# Patient Record
Sex: Female | Born: 1950 | Race: White | Hispanic: Yes | Marital: Married | Smoking: Never smoker
Health system: Southern US, Community
[De-identification: ages and names within clinical notes are randomized; demographics above are authoritative.]

---

## 1999-11-25 ENCOUNTER — Other Ambulatory Visit: Admission: RE | Admit: 1999-11-25 | Discharge: 1999-11-25 | Payer: Self-pay | Admitting: Gynecology

## 2000-12-18 ENCOUNTER — Other Ambulatory Visit: Admission: RE | Admit: 2000-12-18 | Discharge: 2000-12-18 | Payer: Self-pay | Admitting: Gynecology

## 2001-01-13 ENCOUNTER — Encounter: Admission: RE | Admit: 2001-01-13 | Discharge: 2001-04-13 | Payer: Self-pay | Admitting: Gynecology

## 2002-05-16 ENCOUNTER — Other Ambulatory Visit: Admission: RE | Admit: 2002-05-16 | Discharge: 2002-05-16 | Payer: Self-pay | Admitting: Obstetrics and Gynecology

## 2019-10-14 ENCOUNTER — Encounter (HOSPITAL_BASED_OUTPATIENT_CLINIC_OR_DEPARTMENT_OTHER): Payer: Self-pay | Admitting: *Deleted

## 2019-10-14 ENCOUNTER — Emergency Department (HOSPITAL_BASED_OUTPATIENT_CLINIC_OR_DEPARTMENT_OTHER): Payer: Medicare HMO

## 2019-10-14 ENCOUNTER — Other Ambulatory Visit: Payer: Self-pay

## 2019-10-14 ENCOUNTER — Emergency Department (HOSPITAL_BASED_OUTPATIENT_CLINIC_OR_DEPARTMENT_OTHER)
Admission: EM | Admit: 2019-10-14 | Discharge: 2019-10-14 | Disposition: A | Discharge status: Home / Self Care | Payer: Medicare HMO | Attending: Emergency Medicine | Admitting: Emergency Medicine

## 2019-10-14 DIAGNOSIS — S8002XA Contusion of left knee, initial encounter: Secondary | ICD-10-CM | POA: Insufficient documentation

## 2019-10-14 DIAGNOSIS — Y999 Unspecified external cause status: Secondary | ICD-10-CM | POA: Diagnosis not present

## 2019-10-14 DIAGNOSIS — W010XXA Fall on same level from slipping, tripping and stumbling without subsequent striking against object, initial encounter: Secondary | ICD-10-CM | POA: Diagnosis not present

## 2019-10-14 DIAGNOSIS — Y939 Activity, unspecified: Secondary | ICD-10-CM | POA: Insufficient documentation

## 2019-10-14 DIAGNOSIS — Y929 Unspecified place or not applicable: Secondary | ICD-10-CM | POA: Diagnosis not present

## 2019-10-14 DIAGNOSIS — S8992XA Unspecified injury of left lower leg, initial encounter: Secondary | ICD-10-CM | POA: Diagnosis present

## 2019-10-14 NOTE — ED Triage Notes (Signed)
Tripped last night and fell. Injury to her left knee. Swelling and pain. Ambulatory.

## 2019-10-14 NOTE — ED Provider Notes (Signed)
[facility] EMERGENCY DEPARTMENT Provider Note   CSN: [id number] Arrival date & time: 10/14/19  1633     History Chief Complaint  Patient presents with  . Fall    Amanda Leblanc is a 69 y.o. female.  69 year old female who presents with left knee injury.  Yesterday evening, the patient tripped and fell forward, landing on her left knee, her hands, and bumping her forehead.  She did not lose consciousness.  She has had no vomiting, confusion, or severe headache.  Since the fall, she has had progressively worsening swelling of her left knee.  She notes that she is able to walk around okay but it is mostly painful to the touch.  She has been applying ice.  She denies any anticoagulant use.  She notes that she fell in the same knee several months ago.  The history is provided by the patient.  Fall       History reviewed. No pertinent past medical history.  There are no problems to display for this patient.   History reviewed. No pertinent surgical history.   OB History   No obstetric history on file.     No family history on file.  Social History   Tobacco Use  . Smoking status: Never Smoker  . Smokeless tobacco: Never Used  Substance Use Topics  . Alcohol use: Yes  . Drug use: Never    Home Medications Prior to Admission medications   Not on File    Allergies    Patient has no known allergies.  Review of Systems   Review of Systems  Musculoskeletal: Positive for joint swelling.  Skin: Positive for color change. Negative for wound.  Neurological: Negative for numbness.    Physical Exam Updated Vital Signs BP (!) 153/88   Pulse 85   Temp 99.3 F (37.4 C) (Oral)   Resp 20   Ht 5\' 7"  (1.702 m)   Wt 72.6 kg   SpO2 98%   BMI 25.06 kg/m   Physical Exam Vitals and nursing note reviewed.  Constitutional:      General: She is not in acute distress.    Appearance: She is well-developed.  HENT:     Head: Normocephalic and atraumatic.  Eyes:       Conjunctiva/sclera: Conjunctivae normal.  Musculoskeletal:     Cervical back: Neck supple.     Comments: Edema and ecchymosis of L anterior knee w/ swelling of pre-patellar bursa, tender to palpation; normal ROM, no joint laxity; no ankle tenderness or swelling  Skin:    General: Skin is warm and dry.  Neurological:     Mental Status: She is alert and oriented to person, place, and time.  Psychiatric:        Judgment: Judgment normal.     ED Results / Procedures / Treatments   Labs (all labs ordered are listed, but only abnormal results are displayed) Labs Reviewed - No data to display  EKG None  Radiology DG Knee Complete 4 Views Left  Result Date: 10/14/2019 CLINICAL DATA:  Recent fall with left knee pain, initial encounter EXAM: LEFT KNEE - COMPLETE 4+ VIEW COMPARISON:  None. FINDINGS: No acute fracture or dislocation is noted. Mild patellofemoral degenerative changes are seen. Soft tissue swelling is noted anteriorly over the patella consistent with the recent injury. IMPRESSION: Soft tissue swelling without acute bony abnormality. Electronically Signed   By: 12/12/2019 M.D.   On: 10/14/2019 17:53    Procedures Procedures (including critical care time)  Medications Ordered in ED Medications - No data to display  ED Course  I have reviewed the triage vital signs and the nursing notes.  Pertinent imaging results that were available during my care of the patient were reviewed by me and considered in my medical decision making (see chart for details).    MDM Rules/Calculators/A&P                      XR negative for bony injury. Discussed possibility of pre-patellar hematoma or ligamentous injury given swelling, although no large joint effusion. Discussed ice, elevation, pain control. Instructed to f/u with sports med in 2 weeks if no improvement, for consideration of advanced imaging. Final Clinical Impression(s) / ED Diagnoses Final diagnoses:  Contusion of left  knee, initial encounter    Rx / DC Orders ED Discharge Orders    None       Jaianna Nicoll, Wenda Overland, MD 10/14/19 [phone]

## 2019-10-28 ENCOUNTER — Other Ambulatory Visit: Payer: Self-pay

## 2019-10-28 ENCOUNTER — Encounter: Payer: Self-pay | Admitting: Family Medicine

## 2019-10-28 ENCOUNTER — Ambulatory Visit: Payer: Self-pay

## 2019-10-28 ENCOUNTER — Ambulatory Visit: Payer: Medicare HMO | Admitting: Family Medicine

## 2019-10-28 VITALS — BP 145/88 | HR 79 | Ht 67.0 in | Wt 160.0 lb

## 2019-10-28 DIAGNOSIS — M7042 Prepatellar bursitis, left knee: Secondary | ICD-10-CM

## 2019-10-28 DIAGNOSIS — M7052 Other bursitis of knee, left knee: Secondary | ICD-10-CM

## 2019-10-28 NOTE — Patient Instructions (Signed)
Nice to meet you Please try compression  Please try heat  Please try the exercises  Please send me a message in MyChart with any questions or updates.  Please see me back in 4-6 weeks.   --Dr. Jordan Likes

## 2019-10-28 NOTE — Progress Notes (Signed)
Amanda Leblanc - 69 y.o. female MRN [id number]  Date of birth: 08-Mar-1951  SUBJECTIVE:  Including CC & ROS.  Chief Complaint  Patient presents with  . Knee Injury    left knee x 2 weeks    Amanda Leblanc is a 69 y.o. female that is presenting with left knee pain.  She had a fall about 2 weeks ago.  She is evaluated in the emergency department.  She had significant swelling and ecchymosis at that time.  It is slowly improved.  She still endorses swelling over the anterior aspect of the knee.  She has good range of motion.  Pain is mild in nature.  She is able to walk with no significant pain.  No mechanical symptoms.  Independent review of the left knee x-rays from 1/8 shows no significant degenerative changes.   Review of Systems See HPI   HISTORY: Past Medical, Surgical, Social, and Family History Reviewed & Updated per EMR.   Pertinent Historical Findings include:  No past medical history on file.  No past surgical history on file.  No Known Allergies  No family history on file.   Social History   Socioeconomic History  . Marital status: Married    Spouse name: Not on file  . Number of children: Not on file  . Years of education: Not on file  . Highest education level: Not on file  Occupational History  . Not on file  Tobacco Use  . Smoking status: Never Smoker  . Smokeless tobacco: Never Used  Substance and Sexual Activity  . Alcohol use: Yes  . Drug use: Never  . Sexual activity: Not on file  Other Topics Concern  . Not on file  Social History Narrative  . Not on file   Social Determinants of Health   Financial Resource Strain:   . Difficulty of Paying Living Expenses: Not on file  Food Insecurity:   . Worried About Programme researcher, broadcasting/film/video in the Last Year: Not on file  . Ran Out of Food in the Last Year: Not on file  Transportation Needs:   . Lack of Transportation (Medical): Not on file  . Lack of Transportation (Non-Medical): Not on file  Physical  Activity:   . Days of Exercise per Week: Not on file  . Minutes of Exercise per Session: Not on file  Stress:   . Feeling of Stress : Not on file  Social Connections:   . Frequency of Communication with Friends and Family: Not on file  . Frequency of Social Gatherings with Friends and Family: Not on file  . Attends Religious Services: Not on file  . Active Member of Clubs or Organizations: Not on file  . Attends Banker Meetings: Not on file  . Marital Status: Not on file  Intimate Partner Violence:   . Fear of Current or Ex-Partner: Not on file  . Emotionally Abused: Not on file  . Physically Abused: Not on file  . Sexually Abused: Not on file     PHYSICAL EXAM:  VS: BP (!) 145/88   Pulse 79   Ht 5\' 7"  (1.702 m)   Wt 160 lb (72.6 kg)   BMI 25.06 kg/m  Physical Exam Gen: NAD, alert, cooperative with exam, well-appearing ENT: normal lips, normal nasal mucosa,  Eye: normal EOM, normal conjunctiva and lids Skin: no rashes, no areas of induration  Neuro: normal tone, normal sensation to touch Psych:  normal insight, alert and oriented MSK:  Left knee:  No obvious effusion.   There is a prepatellar bursa. Normal range of motion. No tenderness to palpation over the medial or lateral joint line. No instability. Negative McMurray's test. Neurovascularly intact  Limited ultrasound: Left knee:  Mild effusion within the suprapatellar pouch. Normal-appearing quadricep and patellar tendon. Overlying prepatellar bursitis/hematoma. Mild narrowing of the medial joint space  Summary: Prepatellar bursitis  Ultrasound and interpretation by Clearance Coots, MD    ASSESSMENT & PLAN:   Prepatellar bursitis of left knee Prepatellar bursitis or hematoma present since her trauma.  Has good range of motion no significant structural changes within the knee. -Ace wrap and counseled on compression. -Counseled on home exercise therapy and supportive care. -Could consider  aspiration if needed.

## 2019-10-28 NOTE — Assessment & Plan Note (Signed)
Prepatellar bursitis or hematoma present since her trauma.  Has good range of motion no significant structural changes within the knee. -Ace wrap and counseled on compression. -Counseled on home exercise therapy and supportive care. -Could consider aspiration if needed.

## 2019-11-13 ENCOUNTER — Ambulatory Visit: Payer: Medicare HMO | Attending: Internal Medicine

## 2019-11-13 DIAGNOSIS — Z23 Encounter for immunization: Secondary | ICD-10-CM

## 2019-11-13 NOTE — Progress Notes (Signed)
   Covid-19 Vaccination Clinic  Name:  Amanda Leblanc    MRN: [id number] DOB: September 23, 1951  11/13/2019  [name] was observed post Covid-19 immunization for 15 minutes without incidence. She was provided with Vaccine Information Sheet and instruction to access the V-Safe system.   [name] was instructed to call 911 with any severe reactions post vaccine: Marland Kitchen Difficulty breathing  . Swelling of your face and throat  . A fast heartbeat  . A bad rash all over your body  . Dizziness and weakness    Immunizations Administered    Name Date Dose VIS Date Route   Pfizer COVID-19 Vaccine 11/13/2019  5:15 PM 0.3 mL 09/16/2019 Intramuscular   Manufacturer: [employer], [employer]   Lot: [id number]   NDC: [address]-8

## 2019-12-01 ENCOUNTER — Ambulatory Visit: Payer: Medicare HMO

## 2019-12-02 ENCOUNTER — Other Ambulatory Visit: Payer: Self-pay

## 2019-12-02 ENCOUNTER — Ambulatory Visit: Payer: Self-pay

## 2019-12-02 ENCOUNTER — Encounter: Payer: Self-pay | Admitting: Family Medicine

## 2019-12-02 ENCOUNTER — Ambulatory Visit: Payer: Medicare HMO | Admitting: Family Medicine

## 2019-12-02 VITALS — BP 150/81 | HR 85 | Ht 67.0 in | Wt 160.0 lb

## 2019-12-02 DIAGNOSIS — M7042 Prepatellar bursitis, left knee: Secondary | ICD-10-CM

## 2019-12-02 DIAGNOSIS — M7989 Other specified soft tissue disorders: Secondary | ICD-10-CM | POA: Diagnosis not present

## 2019-12-02 NOTE — Assessment & Plan Note (Addendum)
Appears to be maturing with hyperechoic change. May have been more of a hematoma as opposed to a bursa. -Counseled on compression. -Counseled on supportive care. -Can follow-up as needed.

## 2019-12-02 NOTE — Progress Notes (Signed)
Amanda Leblanc - 69 y.o. female MRN [id number]  Date of birth: Jan 08, 1951  SUBJECTIVE:  Including CC & ROS.  Chief Complaint  Patient presents with  . Follow-up    follow up for left knee    Amanda Leblanc is a 69 y.o. female that is following up her left knee problem.  She is having some continued numbness over the anterior aspect of the knee where she sustained a trauma.  She is also having more swelling in the left leg than the right leg.  She has good range of motion.  Denies any significant pain.  Review of Systems See HPI   HISTORY: Past Medical, Surgical, Social, and Family History Reviewed & Updated per EMR.   Pertinent Historical Findings include:  No past medical history on file.  No past surgical history on file.  No family history on file.  Social History   Socioeconomic History  . Marital status: Married    Spouse name: Not on file  . Number of children: Not on file  . Years of education: Not on file  . Highest education level: Not on file  Occupational History  . Not on file  Tobacco Use  . Smoking status: Never Smoker  . Smokeless tobacco: Never Used  Substance and Sexual Activity  . Alcohol use: Yes  . Drug use: Never  . Sexual activity: Not on file  Other Topics Concern  . Not on file  Social History Narrative  . Not on file   Social Determinants of Health   Financial Resource Strain:   . Difficulty of Paying Living Expenses: Not on file  Food Insecurity:   . Worried About Programme researcher, broadcasting/film/video in the Last Year: Not on file  . Ran Out of Food in the Last Year: Not on file  Transportation Needs:   . Lack of Transportation (Medical): Not on file  . Lack of Transportation (Non-Medical): Not on file  Physical Activity:   . Days of Exercise per Week: Not on file  . Minutes of Exercise per Session: Not on file  Stress:   . Feeling of Stress : Not on file  Social Connections:   . Frequency of Communication with Friends and Family: Not on file  .  Frequency of Social Gatherings with Friends and Family: Not on file  . Attends Religious Services: Not on file  . Active Member of Clubs or Organizations: Not on file  . Attends Banker Meetings: Not on file  . Marital Status: Not on file  Intimate Partner Violence:   . Fear of Current or Ex-Partner: Not on file  . Emotionally Abused: Not on file  . Physically Abused: Not on file  . Sexually Abused: Not on file     PHYSICAL EXAM:  VS: BP (!) 150/81   Pulse 85   Ht 5\' 7"  (1.702 m)   Wt 160 lb (72.6 kg)   BMI 25.06 kg/m  Physical Exam Gen: NAD, alert, cooperative with exam, well-appearing MSK:  Left knee/leg: Healing hematoma on the anterior aspect of the knee.  Some redness in this area but no streaking. Normal range of motion. Normal strength resistance. Mild swelling of the lower leg compared to the right leg. Neurovascularly intact  Limited ultrasound: Left knee/leg:  There is hyperechoic change of the hematoma/bursa which is showing maturation.  Normal-appearing patellar tendon. No Baker's cyst appreciated. Veins of the lower leg are compressible  Summary: Maturing hematoma/bursa.  Ultrasound and interpretation by , MD  ASSESSMENT & PLAN:   Prepatellar bursitis of left knee Appears to be maturing with hyperechoic change. May have been more of a hematoma as opposed to a bursa. -Counseled on compression. -Counseled on supportive care. -Can follow-up as needed.  Left leg swelling Slightly more swelling when compared to right. No redness or tenderness on exam. US showing veins compressible.  -Counseled to monitor. -If any change would obtain a formal Doppler

## 2019-12-02 NOTE — Patient Instructions (Signed)
Good to see you Please continue heat, massage, and compression on the area of the knee  Please let me know if the swelling changes in the leg at all   Please send me a message in MyChart with any questions or updates.  Please see [place] back as needed.   --Dr. Jordan Likes

## 2019-12-02 NOTE — Assessment & Plan Note (Signed)
Slightly more swelling when compared to right. No redness or tenderness on exam. US showing veins compressible.  -Counseled to monitor. -If any change would obtain a formal Doppler

## 2019-12-08 ENCOUNTER — Ambulatory Visit: Payer: Medicare HMO | Attending: Internal Medicine

## 2019-12-08 DIAGNOSIS — Z23 Encounter for immunization: Secondary | ICD-10-CM | POA: Insufficient documentation

## 2019-12-08 NOTE — Progress Notes (Signed)
   Covid-19 Vaccination Clinic  Name:  Amanda Leblanc    MRN: [id number] DOB: 13-Dec-1950  12/08/2019  Amanda Leblanc was observed post Covid-19 immunization for 15 minutes without incident. She was provided with Vaccine Information Sheet and instruction to access the V-Safe system.   Amanda Leblanc was instructed to call 911 with any severe reactions post vaccine: Marland Kitchen Difficulty breathing  . Swelling of face and throat  . A fast heartbeat  . A bad rash all over body  . Dizziness and weakness   Immunizations Administered    Name Date Dose VIS Date Route   Pfizer COVID-19 Vaccine 12/08/2019 12:21 PM 0.3 mL 09/16/2019 Intramuscular   Manufacturer: [employer], [employer]   Lot: [id number]   NDC: [address]-9

## 2021-09-13 IMAGING — DX DG KNEE COMPLETE 4+V*L*
4 series · 4 of 4 positions shown · non-contrast
Comparison: None.

CLINICAL DATA: Recent fall with left knee pain, initial encounter

EXAM:
LEFT KNEE - COMPLETE 4+ VIEW

[knee ap]
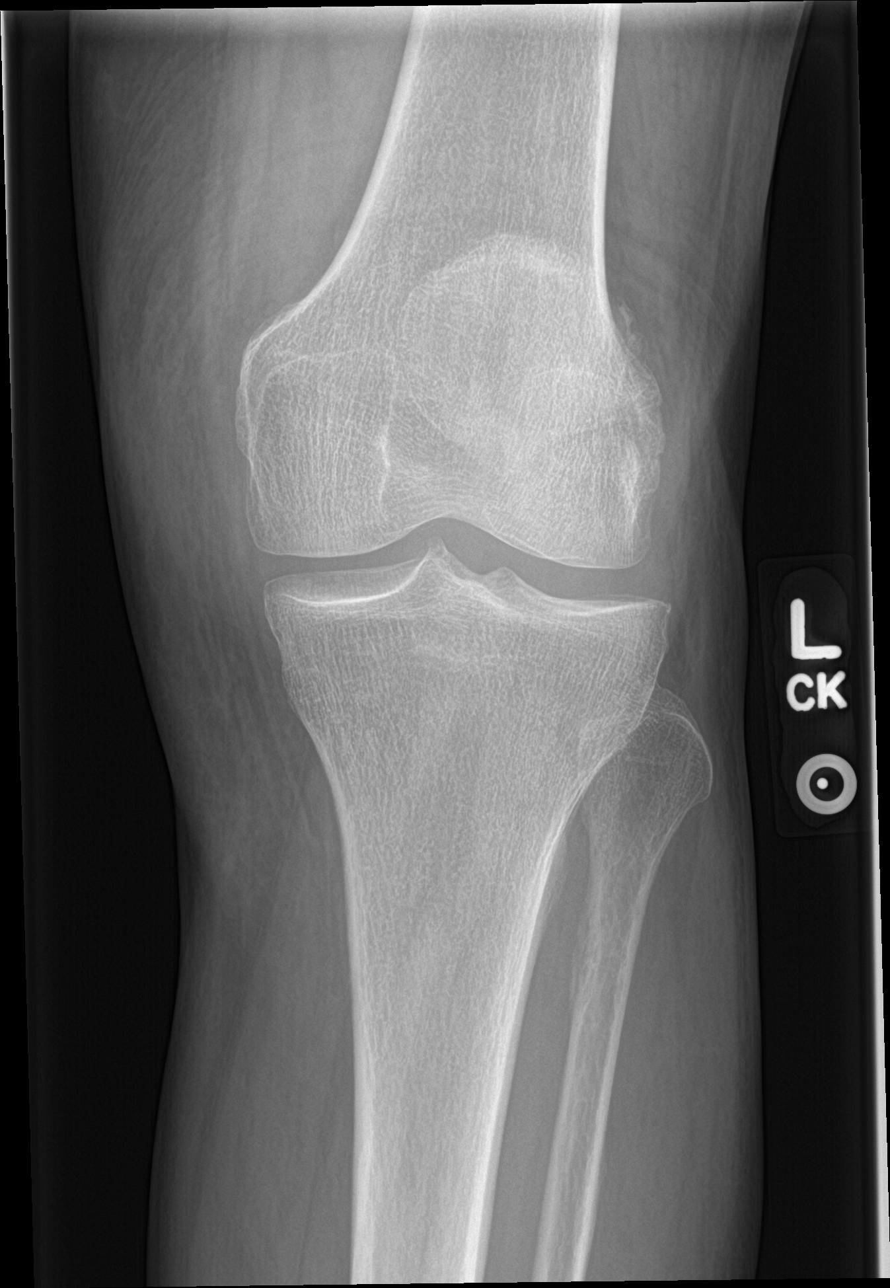

[knee lat]
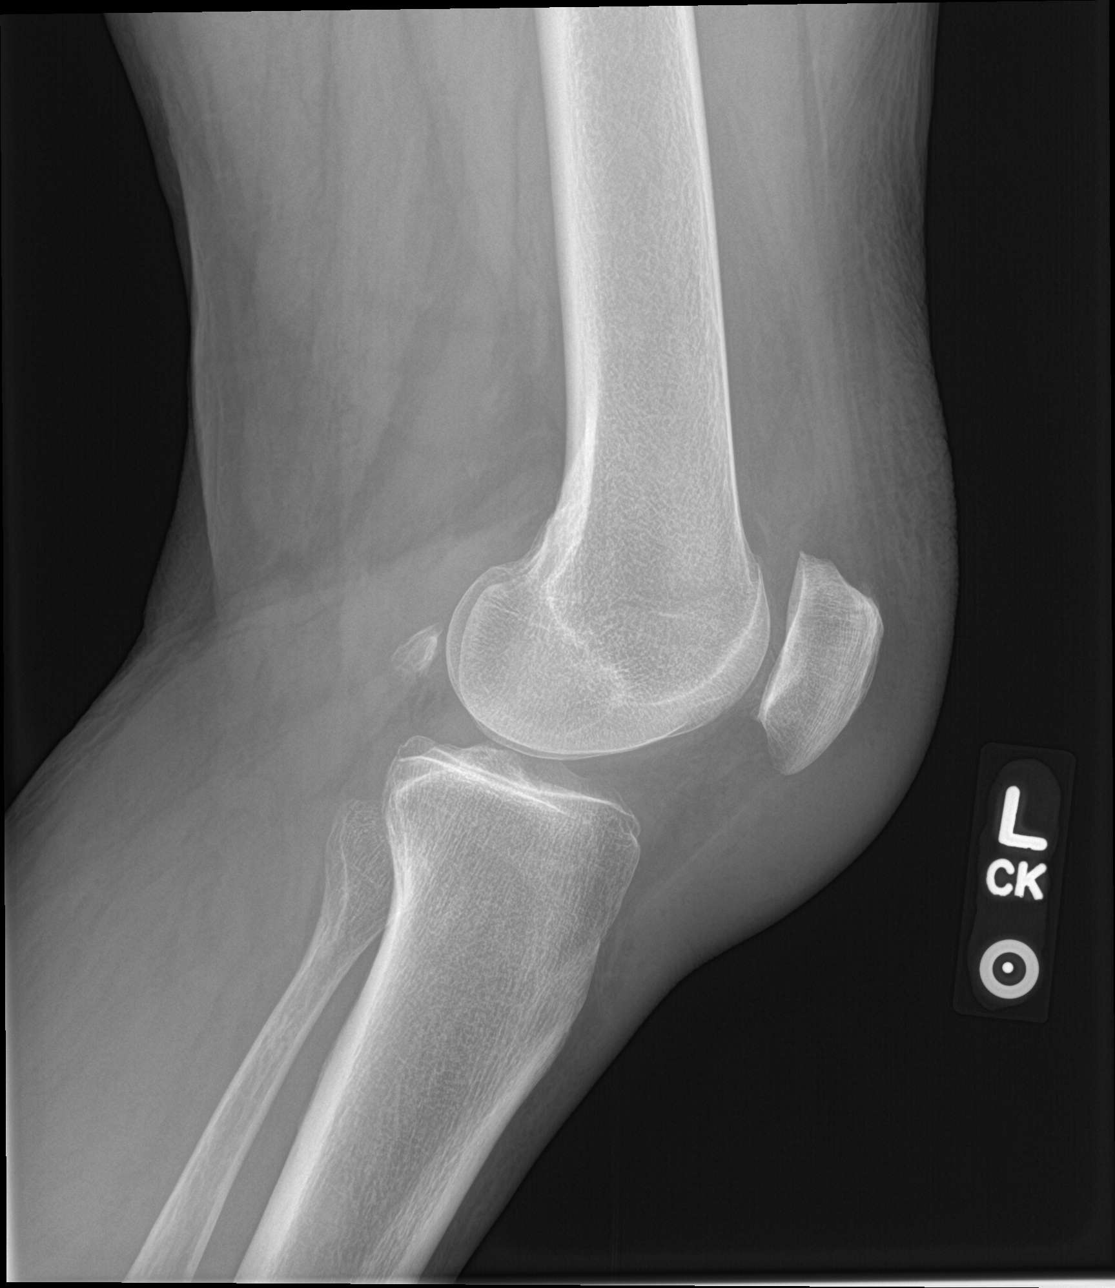

[knee obl (1 of 2)]
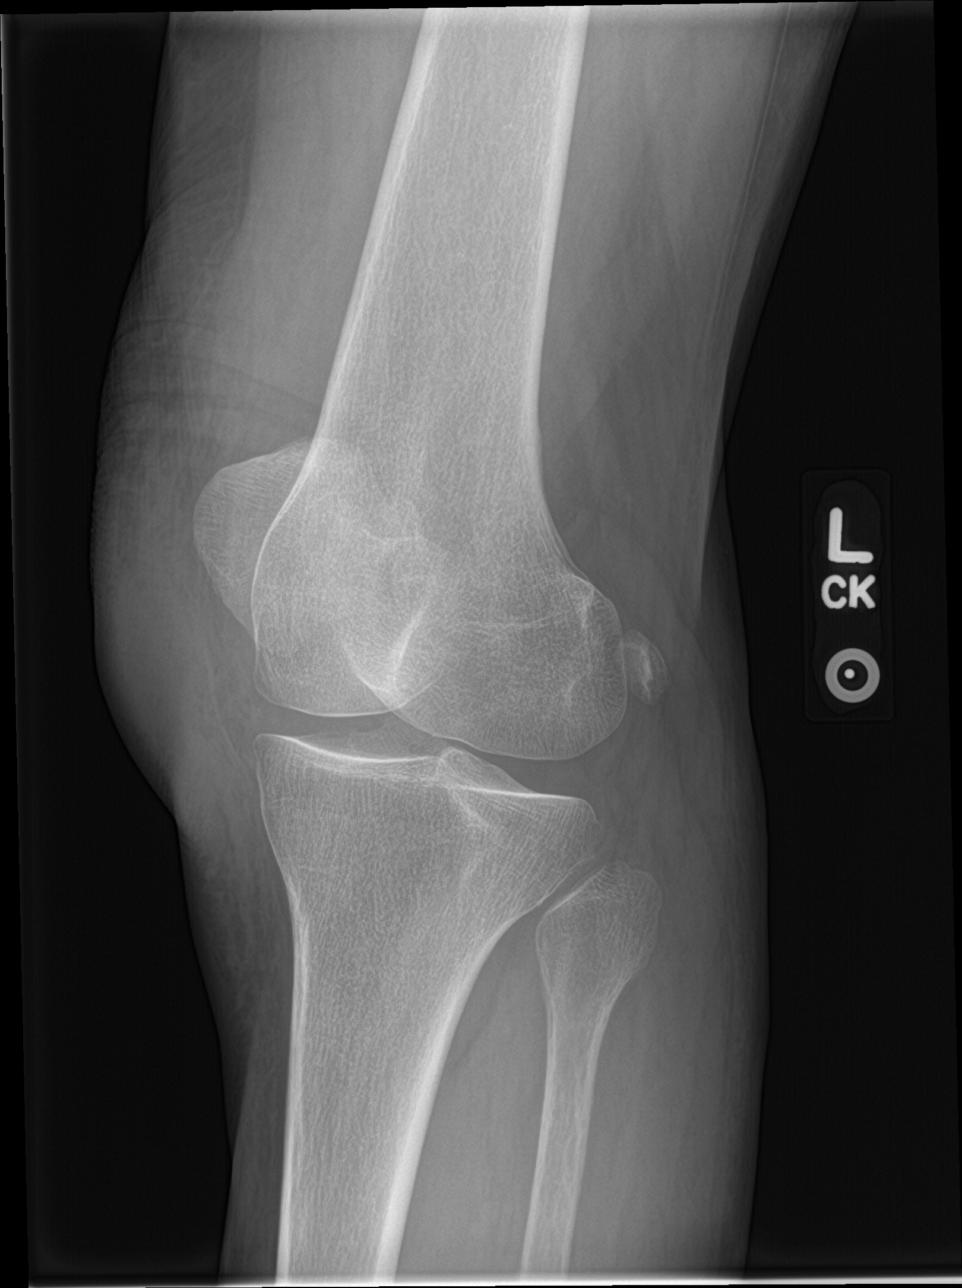

[knee obl (2 of 2)]
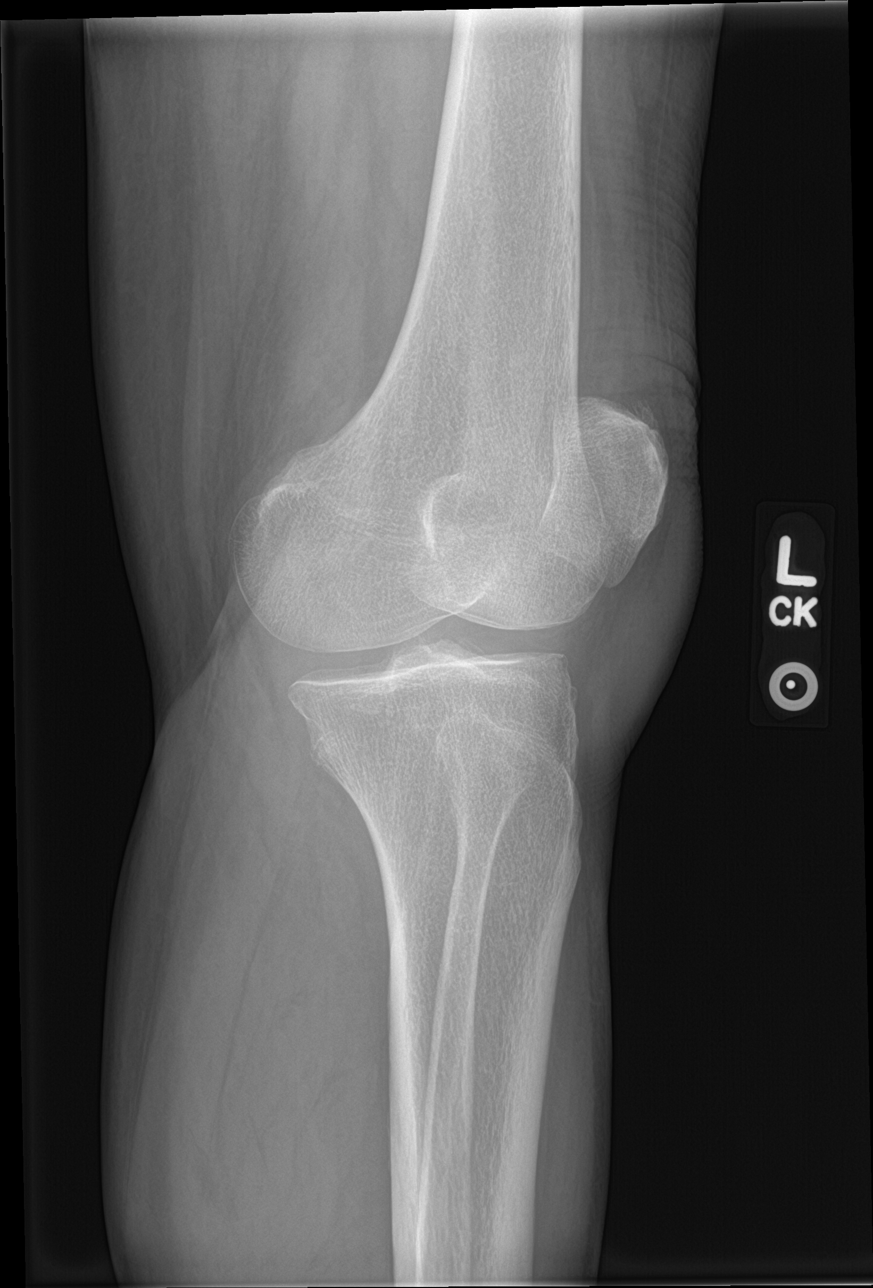

[4 of 4 positions shown; findings below may reference images not displayed]

FINDINGS: No acute fracture or dislocation is noted. Mild patellofemoral
degenerative changes are seen. Soft tissue swelling is noted
anteriorly over the patella consistent with the recent injury.
IMPRESSION: Soft tissue swelling without acute bony abnormality.

## 2023-01-21 ENCOUNTER — Encounter: Payer: Self-pay | Admitting: *Deleted

## 2023-03-11 ENCOUNTER — Emergency Department (HOSPITAL_COMMUNITY): Payer: Medicare HMO

## 2023-03-11 ENCOUNTER — Emergency Department (HOSPITAL_COMMUNITY)
Admission: EM | Admit: 2023-03-11 | Discharge: 2023-03-11 | Disposition: A | Discharge status: Home / Self Care | Payer: Medicare HMO | Attending: Emergency Medicine | Admitting: Emergency Medicine

## 2023-03-11 ENCOUNTER — Encounter (HOSPITAL_COMMUNITY): Payer: Self-pay

## 2023-03-11 ENCOUNTER — Other Ambulatory Visit: Payer: Self-pay

## 2023-03-11 DIAGNOSIS — S43004A Unspecified dislocation of right shoulder joint, initial encounter: Secondary | ICD-10-CM

## 2023-03-11 DIAGNOSIS — S40911A Unspecified superficial injury of right shoulder, initial encounter: Secondary | ICD-10-CM | POA: Diagnosis present

## 2023-03-11 DIAGNOSIS — Y92009 Unspecified place in unspecified non-institutional (private) residence as the place of occurrence of the external cause: Secondary | ICD-10-CM | POA: Diagnosis not present

## 2023-03-11 DIAGNOSIS — W19XXXA Unspecified fall, initial encounter: Secondary | ICD-10-CM | POA: Insufficient documentation

## 2023-03-11 MED ORDER — HYDROMORPHONE HCL 1 MG/ML IJ SOLN
0.5000 mg | Freq: Once | INTRAMUSCULAR | Status: AC
  Administered 2023-03-11: 0.5 mg via INTRAVENOUS
  Filled 2023-03-11: qty 1

## 2023-03-11 MED ORDER — PROPOFOL 10 MG/ML IV BOLUS
30.0000 mg | Freq: Once | INTRAVENOUS | Status: AC
  Administered 2023-03-11: 30 mg via INTRAVENOUS
  Filled 2023-03-11: qty 20

## 2023-03-11 MED ORDER — PROPOFOL 10 MG/ML IV BOLUS
20.0000 mg | Freq: Once | INTRAVENOUS | Status: AC
  Administered 2023-03-11: 20 mg via INTRAVENOUS

## 2023-03-11 MED ORDER — IBUPROFEN 800 MG PO TABS: 800.0000 mg | ORAL_TABLET | Freq: Three times a day (TID) | ORAL | 0 refills | Status: AC | PRN

## 2023-03-11 NOTE — ED Notes (Signed)
Patient back to baseline mentation

## 2023-03-11 NOTE — ED Notes (Signed)
X-ray at bedside

## 2023-03-11 NOTE — ED Triage Notes (Signed)
  Pt BIBA from home after a fall. Denies LOC, No Blood thinners.Arrived in sling

## 2023-03-11 NOTE — ED Notes (Signed)
MD  Runell Gess, Ed Tech and Respiratory at bedside

## 2023-03-11 NOTE — ED Notes (Signed)
Pt d/c with friend who came to pick her up. Walked patient to [employer], outside to friends car. AAOX4, NAD. Ambulatory with stead gait. D/c instructions reviewed. All questions answered.

## 2023-03-11 NOTE — ED Notes (Signed)
MD Zammit  began manipulating shoulder

## 2023-03-11 NOTE — ED Provider Notes (Signed)
[place] EMERGENCY DEPARTMENT AT [facility] Provider Note   CSN: [id number] Arrival date & time: 03/11/23  1153     History {Add pertinent medical, surgical, social history, OB history to HPI:1} Chief Complaint  Patient presents with   Fall    Pt BIBA from home after a fall. Denies LOC, No Blood thinners.Arrived in sling.    Amanda Leblanc is a 72 y.o. female.  Patient states she fell accidentally at the store and hurt her right shoulder.  Patient did not hit her head.   Fall       Home Medications Prior to Admission medications   Medication Sig Start Date End Date Taking? Authorizing Provider  ibuprofen (ADVIL) 800 MG tablet Take 1 tablet (800 mg total) by mouth every 8 (eight) hours as needed for moderate pain. 03/11/23  Yes Bethann Berkshire, MD      Allergies    Patient has no known allergies.    Review of Systems   Review of Systems  Physical Exam Updated Vital Signs BP 122/69   Pulse 63   Temp 98.3 F (36.8 C) (Oral)   Resp 13   Ht 5\' 7"  (1.702 m)   Wt 69.9 kg   SpO2 95%   BMI 24.12 kg/m  Physical Exam  ED Results / Procedures / Treatments   Labs (all labs ordered are listed, but only abnormal results are displayed) Labs Reviewed - No data to display  EKG None  Radiology DG Shoulder Right Portable  Result Date: 03/11/2023 CLINICAL DATA:  Reduction of anterior dislocation EXAM: RIGHT SHOULDER - 1 VIEW COMPARISON:  Study done earlier today FINDINGS: There is interval reduction of anterior dislocation. Hill-Sachs deformity seen in proximal humerus. IMPRESSION: Interval reduction of anterior dislocation. Hill-Sachs deformity is seen in proximal right humerus. Electronically Signed   By: Ernie Avena M.D.   On: 03/11/2023 13:54   DG Shoulder Right Portable  Result Date: 03/11/2023 CLINICAL DATA:  Fall onto right shoulder EXAM: RIGHT SHOULDER - 1 VIEW COMPARISON:  None Available. FINDINGS: There is anterior dislocation of the right  glenohumeral joint. No visible fracture. AC joint is intact. Right lung clear. IMPRESSION: Right shoulder anterior dislocation. Electronically Signed   By: Charlett Nose M.D.   On: 03/11/2023 12:22    Procedures Procedures  {Document cardiac monitor, telemetry assessment procedure when appropriate:1}  Medications Ordered in ED Medications  HYDROmorphone (DILAUDID) injection 0.5 mg (0.5 mg Intravenous Given 03/11/23 1254)  propofol (DIPRIVAN) 10 mg/mL bolus/IV push 30 mg (30 mg Intravenous Given 03/11/23 1305)  propofol (DIPRIVAN) 10 mg/mL bolus/IV push 20 mg (20 mg Intravenous Given 03/11/23 1308)    ED Course/ Medical Decision Making/ A&P   {   Click here for ABCD2, HEART and other calculatorsREFRESH Note before signing :1}                          Medical Decision Making Amount and/or Complexity of Data Reviewed Radiology: ordered.  Risk Prescription drug management.   Patient with a dislocated right shoulder that was reduced in the emergency department.  She is given a sling and Motrin and will follow-up with Ortho  {Document critical care time when appropriate:1} {Document review of labs and clinical decision tools ie heart score, Chads2Vasc2 etc:1}  {Document your independent review of radiology images, and any outside records:1} {Document your discussion with family members, caretakers, and with consultants:1} {Document social determinants of health affecting pt's care:1} {Document your decision  making why or why not admission, treatments were needed:1} Final Clinical Impression(s) / ED Diagnoses Final diagnoses:  Dislocation of right shoulder joint, initial encounter    Rx / DC Orders ED Discharge Orders          Ordered    ibuprofen (ADVIL) 800 MG tablet  Every 8 hours PRN        03/11/23 1457

## 2023-03-11 NOTE — ED Notes (Signed)
Pt waiting on ride

## 2023-03-11 NOTE — Discharge Instructions (Signed)
Follow-up with Dr. August Saucer or whichever orthopedic you would like to see.

## 2023-03-12 ENCOUNTER — Telehealth: Payer: Self-pay | Admitting: Orthopedic Surgery

## 2023-03-12 NOTE — Telephone Encounter (Signed)
Pt called and she was seen at [facility] ED for right dislocated shoulder. Discharge to see August Saucer . No open appts. Please call pt about this matter at [phone].

## 2023-03-13 NOTE — Telephone Encounter (Signed)
Can you please cb to schedule on one of these days

## 2023-03-13 NOTE — Telephone Encounter (Signed)
How quickly does this need to be worked in?

## 2023-03-13 NOTE — Telephone Encounter (Signed)
I do not think this is necessarily an urgent work in.  We can see her back in either Wednesday the 12th or Friday the 14th

## 2023-03-16 ENCOUNTER — Ambulatory Visit: Payer: Medicare HMO | Admitting: Orthopedic Surgery

## 2023-03-16 DIAGNOSIS — S43014A Anterior dislocation of right humerus, initial encounter: Secondary | ICD-10-CM | POA: Diagnosis not present

## 2023-03-17 ENCOUNTER — Encounter: Payer: Self-pay | Admitting: Orthopedic Surgery

## 2023-03-17 NOTE — Progress Notes (Signed)
   Office Visit Note   Patient: Amanda Leblanc           Date of Birth: 25-May-1951           MRN: [id number] Visit Date: 03/16/2023 Requested by: Caffie Damme, MD [address] [place],  [place] [address] PCP: Caffie Damme, MD  Subjective: Chief Complaint  Patient presents with   Right Shoulder - Dislocation, Follow-up    DOI 03/11/2023    HPI: Amanda Leblanc is a 73 y.o. female who presents to the office reporting right shoulder dislocation.  Date of injury 03/11/2023.  Dislocated the right shoulder after a fall.  Reduced in the emergency department.  Radiographs confirm reduction.  No associated fracture.  She was in the sling for 3 days and has been using the arm since that time.  Denies much in the way of weakness or recurrent instability.  She did have an injection in that shoulder about 5 years ago.  She is very active.  No medications for pain.  She is right-hand dominant..                ROS: All systems reviewed are negative as they relate to the chief complaint within the history of present illness.  Patient denies fevers or chills.  Assessment & Plan: Visit Diagnoses: No diagnosis found.  Plan: Impression is right shoulder pain following shoulder dislocation.  No recurrent instability.  Motion is pretty reasonable today as well as rotator cuff strength.  She has forward flexion and abduction both to 70 degrees.  We discussed therapy which she wants to try doing some exercises on her own.  Follow-up with [place] in 4 weeks for clinical recheck with decision for or against MRI scanning at that time based on her clinical recovery and presence or absence of rotator cuff weakness.  Follow-Up Instructions: No follow-ups on file.   Orders:  No orders of the defined types were placed in this encounter.  No orders of the defined types were placed in this encounter.     Procedures: No procedures performed   Clinical Data: No additional findings.  Objective: Vital Signs: There were no  vitals taken for this visit.  Physical Exam:  Constitutional: Patient appears well-developed HEENT:  Head: Normocephalic Eyes:EOM are normal Neck: Normal range of motion Cardiovascular: Normal rate Pulmonary/chest: Effort normal Neurologic: Patient is alert Skin: Skin is warm Psychiatric: Patient has normal mood and affect  Ortho Exam: Ortho exam demonstrates functional deltoid.  Slight difference in global rotator cuff strength right versus left but nothing isolated.  No Popeye deformity.  Motor sensory function to the hand is intact.  Passive range of motion without crepitus but with mild pain above 70 degrees of forward flexion and abduction.  Specialty Comments:  No specialty comments available.  Imaging: No results found.   PMFS History: Patient Active Problem List   Diagnosis Date Noted   Left leg swelling 12/02/2019   Prepatellar bursitis of left knee 10/28/2019   No past medical history on file.  No family history on file.  No past surgical history on file. Social History   Occupational History   Not on file  Tobacco Use   Smoking status: Never   Smokeless tobacco: Never  Substance and Sexual Activity   Alcohol use: Yes   Drug use: Never   Sexual activity: Not on file

## 2023-04-02 ENCOUNTER — Other Ambulatory Visit (HOSPITAL_BASED_OUTPATIENT_CLINIC_OR_DEPARTMENT_OTHER): Payer: Self-pay | Admitting: Family Medicine

## 2023-04-02 DIAGNOSIS — Z1231 Encounter for screening mammogram for malignant neoplasm of breast: Secondary | ICD-10-CM

## 2023-04-06 ENCOUNTER — Ambulatory Visit (HOSPITAL_BASED_OUTPATIENT_CLINIC_OR_DEPARTMENT_OTHER)
Admission: RE | Admit: 2023-04-06 | Discharge: 2023-04-06 | Disposition: A | Discharge status: Home / Self Care | Payer: Medicare HMO | Source: Ambulatory Visit | Attending: Family Medicine | Admitting: Family Medicine

## 2023-04-06 ENCOUNTER — Encounter (HOSPITAL_BASED_OUTPATIENT_CLINIC_OR_DEPARTMENT_OTHER): Payer: Self-pay

## 2023-04-06 DIAGNOSIS — Z1231 Encounter for screening mammogram for malignant neoplasm of breast: Secondary | ICD-10-CM | POA: Insufficient documentation

## 2023-04-17 ENCOUNTER — Ambulatory Visit: Payer: Medicare HMO | Admitting: Orthopedic Surgery

## 2023-04-17 ENCOUNTER — Encounter: Payer: Self-pay | Admitting: Orthopedic Surgery

## 2023-04-17 DIAGNOSIS — S43014A Anterior dislocation of right humerus, initial encounter: Secondary | ICD-10-CM | POA: Diagnosis not present

## 2023-04-17 NOTE — Progress Notes (Signed)
   Office Visit Note   Patient: Amanda Leblanc           Date of Birth: 10-04-1951           MRN: [id number] Visit Date: 04/17/2023 Requested by: Caffie Damme, MD [address] [place],  [place] [address] PCP: Caffie Damme, MD  Subjective: Chief Complaint  Patient presents with   Right Shoulder - Injury    HPI: Amanda Leblanc is a 72 y.o. female who presents to the office reporting Amanda Leblanc is a 72 year old patient with right shoulder dislocation.  Date of injury 03/11/2023.  Decision point today was for or against MRI scanning.  She states she does have to use her left arm to help raise the right arm sequentially.  Pain occasionally wakes her from sleep at night.  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                ROS: All systems reviewed are negative as they relate to the chief complaint within the history of present illness.  Patient denies fevers or chills.  Assessment & Plan: Visit Diagnoses:  1. Anterior dislocation of right shoulder, initial encounter     Plan: Impression is shoulder dislocation in a 72 year old patient.  Today her examination and history is more consistent with rotator cuff pathology.  Having some weakness and some coarseness with rotation.  She is very active.  Plan MRI arthrogram right shoulder to evaluate rotator cuff tear following dislocation.  Has not really had any episodes of instability.  We will assess the tear and options at that time.  Follow-Up Instructions: No follow-ups on file.   Orders:  Orders Placed This Encounter  Procedures   MR SHOULDER RIGHT W CONTRAST   Arthrogram   No orders of the defined types were placed in this encounter.     Procedures: No procedures performed   Clinical Data: No additional findings.  Objective: Vital Signs: There were no vitals taken for this visit.  Physical Exam:  Constitutional: Patient appears well-developed HEENT:  Head: Normocephalic Eyes:EOM are  normal Neck: Normal range of motion Cardiovascular: Normal rate Pulmonary/chest: Effort normal Neurologic: Patient is alert Skin: Skin is warm Psychiatric: Patient has normal mood and affect  Ortho Exam: Ortho exam demonstrates functional deltoid.  Does have some coarseness with internal/external rotation 9 degrees of abduction localizing to the anterior lateral aspect of the shoulder.  Slight weakness to supraspinatus testing with 5 out of 5 external rotation on the right 5+ out of 5 on the left.  No masses lymphadenopathy or skin changes noted in that shoulder girdle region.  No Popeye deformity.  Specialty Comments:  No specialty comments available.  Imaging: No results found.   PMFS History: Patient Active Problem List   Diagnosis Date Noted   Left leg swelling 12/02/2019   Prepatellar bursitis of left knee 10/28/2019   History reviewed. No pertinent past medical history.  History reviewed. No pertinent family history.  History reviewed. No pertinent surgical history. Social History   Occupational History   Not on file  Tobacco Use   Smoking status: Never   Smokeless tobacco: Never  Substance and Sexual Activity   Alcohol use: Yes   Drug use: Never   Sexual activity: Not on file

## 2023-06-03 ENCOUNTER — Encounter: Payer: Self-pay | Admitting: Orthopedic Surgery

## 2023-06-09 ENCOUNTER — Ambulatory Visit
Admission: RE | Admit: 2023-06-09 | Discharge: 2023-06-09 | Disposition: A | Discharge status: Home / Self Care | Payer: Medicare HMO | Source: Ambulatory Visit | Attending: Orthopedic Surgery | Admitting: Orthopedic Surgery

## 2023-06-09 ENCOUNTER — Inpatient Hospital Stay: Admission: RE | Admit: 2023-06-09 | Payer: Medicare HMO | Source: Ambulatory Visit

## 2023-06-09 DIAGNOSIS — S43014A Anterior dislocation of right humerus, initial encounter: Secondary | ICD-10-CM

## 2023-06-09 MED ORDER — IOPAMIDOL (ISOVUE-M 200) INJECTION 41%
12.0000 mL | Freq: Once | INTRAMUSCULAR | Status: AC
  Administered 2023-06-09: 12 mL via INTRA_ARTICULAR

## 2023-06-17 ENCOUNTER — Ambulatory Visit: Payer: Medicare HMO | Admitting: Orthopedic Surgery

## 2023-06-29 ENCOUNTER — Encounter: Payer: Self-pay | Admitting: Orthopedic Surgery

## 2023-06-29 ENCOUNTER — Ambulatory Visit: Payer: Medicare HMO | Admitting: Orthopedic Surgery

## 2023-06-29 DIAGNOSIS — M75121 Complete rotator cuff tear or rupture of right shoulder, not specified as traumatic: Secondary | ICD-10-CM

## 2023-06-29 NOTE — Progress Notes (Signed)
Office Visit Note   Patient: Amanda Leblanc           Date of Birth: 27-Feb-1951           MRN: [id number] Visit Date: 06/29/2023 Requested by: Caffie Damme, MD [address] [place],  [place] [address] PCP: Caffie Damme, MD  Subjective: Chief Complaint  Patient presents with   Other     Review MRI    HPI: Amanda Leblanc is a 72 y.o. female who presents to the office reporting right shoulder pain.  Since she was last seen she has had an MRI scan.  Hurts her to reach out.  Hard for her to hang close.  She reports active forward flexion of around 45 and active abduction of around 60.  Some difficulty with driving but still that she manages to do this.  It is hard for her to pain.  MRI scan is reviewed with the patient and it shows massive rotator cuff tear with retraction of the posterior superior rotator cuff..                ROS: All systems reviewed are negative as they relate to the chief complaint within the history of present illness.  Patient denies fevers or chills.  Assessment & Plan: Visit Diagnoses:  1. Complete tear of right rotator cuff, unspecified whether traumatic     Plan: Impression is massive rotator cuff tear with some weakness but overall she is got a marginal amount of function remaining in that right shoulder.  Pain is a bigger problem than weakness.  This tear does not look repairable.  I think she has to decide if she wants to live with this do some physical therapy to try to get the remaining force couple muscles of her rotator cuff stronger or consider reverse replacement.  She wants to go the physical therapy route.  Will see her back in 2 months with clinical recheck to evaluate how satisfied she is with her level of function  Follow-Up Instructions: Return in about 2 months (around 08/29/2023).   Orders:  Orders Placed This Encounter  Procedures   Ambulatory referral to Physical Therapy   No orders of the defined types were placed in this  encounter.     Procedures: No procedures performed   Clinical Data: No additional findings.  Objective: Vital Signs: There were no vitals taken for this visit.  Physical Exam:  Constitutional: Patient appears well-developed HEENT:  Head: Normocephalic Eyes:EOM are normal Neck: Normal range of motion Cardiovascular: Normal rate Pulmonary/chest: Effort normal Neurologic: Patient is alert Skin: Skin is warm Psychiatric: Patient has normal mood and affect  Ortho Exam: Ortho exam demonstrates active forward flexion to 45 active abduction to 60.  Passively I can get her over 90 degrees.  Subscap strength 5 out of 5 external rotation strength is 4- out of 5 on the right.  No discrete AC joint tenderness.  Motor or sensory function in the hand is intact.  Cervical spine range of motion is full.  There is some crepitus with passive range of motion of that right shoulder.  Specialty Comments:  No specialty comments available.  Imaging: No results found.   PMFS History: Patient Active Problem List   Diagnosis Date Noted   Left leg swelling 12/02/2019   Prepatellar bursitis of left knee 10/28/2019   History reviewed. No pertinent past medical history.  History reviewed. No pertinent family history.  History reviewed. No pertinent surgical history. Social History  Occupational History   Not on file  Tobacco Use   Smoking status: Never   Smokeless tobacco: Never  Substance and Sexual Activity   Alcohol use: Yes   Drug use: Never   Sexual activity: Not on file

## 2023-08-21 ENCOUNTER — Ambulatory Visit: Payer: Medicare HMO | Admitting: Orthopedic Surgery

## 2024-08-08 ENCOUNTER — Encounter: Payer: Self-pay | Admitting: Radiology
